# Patient Record
Sex: Female | Born: 1955 | Hispanic: Yes | Smoking: Never smoker
Health system: Southern US, Community
[De-identification: ages and names within clinical notes are randomized; demographics above are authoritative.]

## PROBLEM LIST (undated history)

## (undated) DIAGNOSIS — E119 Type 2 diabetes mellitus without complications: Secondary | ICD-10-CM

## (undated) DIAGNOSIS — N390 Urinary tract infection, site not specified: Secondary | ICD-10-CM

## (undated) DIAGNOSIS — F32A Depression, unspecified: Secondary | ICD-10-CM

## (undated) DIAGNOSIS — I10 Essential (primary) hypertension: Secondary | ICD-10-CM

## (undated) HISTORY — DX: Urinary tract infection, site not specified: N39.0

## (undated) HISTORY — PX: FOOT SURGERY: SHX648

---

## 2003-05-24 ENCOUNTER — Other Ambulatory Visit: Payer: Self-pay

## 2003-05-31 ENCOUNTER — Other Ambulatory Visit: Payer: Self-pay

## 2003-11-24 ENCOUNTER — Ambulatory Visit: Payer: Self-pay

## 2005-05-14 ENCOUNTER — Ambulatory Visit: Payer: Self-pay

## 2007-01-29 ENCOUNTER — Emergency Department: Payer: Self-pay | Admitting: Emergency Medicine

## 2007-05-11 ENCOUNTER — Ambulatory Visit: Payer: Self-pay

## 2008-07-26 ENCOUNTER — Ambulatory Visit: Payer: Self-pay

## 2010-12-17 ENCOUNTER — Ambulatory Visit: Payer: Self-pay

## 2012-12-13 ENCOUNTER — Ambulatory Visit: Payer: Self-pay | Admitting: Internal Medicine

## 2013-02-23 ENCOUNTER — Ambulatory Visit: Payer: Self-pay | Admitting: Urgent Care

## 2014-09-13 ENCOUNTER — Ambulatory Visit: Payer: Self-pay | Attending: Oncology

## 2015-07-23 ENCOUNTER — Ambulatory Visit: Payer: Self-pay

## 2015-08-22 ENCOUNTER — Ambulatory Visit: Payer: Self-pay | Attending: Internal Medicine

## 2017-04-22 ENCOUNTER — Ambulatory Visit: Payer: Self-pay | Attending: Oncology

## 2018-09-06 ENCOUNTER — Ambulatory Visit
Admission: RE | Admit: 2018-09-06 | Discharge: 2018-09-06 | Disposition: A | Discharge status: Home / Self Care | Payer: Self-pay | Source: Ambulatory Visit | Attending: General Practice | Admitting: General Practice

## 2018-09-06 ENCOUNTER — Other Ambulatory Visit: Payer: Self-pay | Admitting: General Practice

## 2018-09-06 DIAGNOSIS — M542 Cervicalgia: Secondary | ICD-10-CM | POA: Insufficient documentation

## 2021-03-17 ENCOUNTER — Emergency Department: Payer: Medicare Other

## 2021-03-17 ENCOUNTER — Other Ambulatory Visit: Payer: Self-pay

## 2021-03-17 ENCOUNTER — Emergency Department
Admission: EM | Admit: 2021-03-17 | Discharge: 2021-03-17 | Disposition: A | Discharge status: Home / Self Care | Payer: Medicare Other | Attending: Emergency Medicine | Admitting: Emergency Medicine

## 2021-03-17 DIAGNOSIS — S6991XA Unspecified injury of right wrist, hand and finger(s), initial encounter: Secondary | ICD-10-CM | POA: Diagnosis present

## 2021-03-17 DIAGNOSIS — S66110A Strain of flexor muscle, fascia and tendon of right index finger at wrist and hand level, initial encounter: Secondary | ICD-10-CM

## 2021-03-17 DIAGNOSIS — R002 Palpitations: Secondary | ICD-10-CM | POA: Diagnosis not present

## 2021-03-17 DIAGNOSIS — X58XXXA Exposure to other specified factors, initial encounter: Secondary | ICD-10-CM | POA: Insufficient documentation

## 2021-03-17 HISTORY — DX: Essential (primary) hypertension: I10

## 2021-03-17 HISTORY — DX: Depression, unspecified: F32.A

## 2021-03-17 HISTORY — DX: Type 2 diabetes mellitus without complications: E11.9

## 2021-03-17 LAB — COMPREHENSIVE METABOLIC PANEL
ALT: 20 U/L (ref 0–44)
AST: 24 U/L (ref 15–41)
Albumin: 3.8 g/dL (ref 3.5–5.0)
Alkaline Phosphatase: 57 U/L (ref 38–126)
Anion gap: 8 (ref 5–15)
BUN: 17 mg/dL (ref 8–23)
CO2: 29 mmol/L (ref 22–32)
Calcium: 9.4 mg/dL (ref 8.9–10.3)
Chloride: 100 mmol/L (ref 98–111)
Creatinine, Ser: 0.66 mg/dL (ref 0.44–1.00)
GFR, Estimated: >60 mL/min (ref >60)
Glucose, Bld: 161 mg/dL — ABNORMAL HIGH (ref 70–99)
Potassium: 3.9 mmol/L (ref 3.5–5.1)
Sodium: 137 mmol/L (ref 135–145)
Total Bilirubin: 0.5 mg/dL (ref 0.3–1.2)
Total Protein: 8.1 g/dL (ref 6.5–8.1)

## 2021-03-17 LAB — CBC WITH DIFFERENTIAL/PLATELET
Abs Immature Granulocytes: 0.03 10*3/uL (ref 0.00–0.07)
Basophils Absolute: 0 10*3/uL (ref 0.0–0.1)
Basophils Relative: 0 %
Eosinophils Absolute: 0 10*3/uL (ref 0.0–0.5)
Eosinophils Relative: 0 %
HCT: 41.4 % (ref 36.0–46.0)
Hemoglobin: 14.1 g/dL (ref 12.0–15.0)
Immature Granulocytes: 1 %
Lymphocytes Relative: 38 %
Lymphs Abs: 2 10*3/uL (ref 0.7–4.0)
MCH: 31.5 pg (ref 26.0–34.0)
MCHC: 34.1 g/dL (ref 30.0–36.0)
MCV: 92.6 fL (ref 80.0–100.0)
Monocytes Absolute: 0.4 10*3/uL (ref 0.1–1.0)
Monocytes Relative: 8 %
Neutro Abs: 2.9 10*3/uL (ref 1.7–7.7)
Neutrophils Relative %: 53 %
Platelets: 227 10*3/uL (ref 150–400)
RBC: 4.47 MIL/uL (ref 3.87–5.11)
RDW: 12.2 % (ref 11.5–15.5)
WBC: 5.4 10*3/uL (ref 4.0–10.5)
nRBC: 0 % (ref 0.0–0.2)

## 2021-03-17 LAB — TSH: TSH: 0.719 u[IU]/mL (ref 0.350–4.500)

## 2021-03-17 MED ORDER — ACETAMINOPHEN 500 MG PO TABS
1000.0000 mg | ORAL_TABLET | ORAL | Status: AC
  Administered 2021-03-17: 1000 mg via ORAL
  Filled 2021-03-17: qty 2

## 2021-03-17 NOTE — ED Provider Notes (Signed)
? ?[facility] ?Provider Note ? ? ? Event Date/Time  ? First MD Initiated Contact with Patient 03/17/21 1545   ?  (approximate) ? ? ?History  ? ?Arm Pain ? ? ?HPI ? ?Christina Hatfield is a 66 y.o. female here for evaluation for pain in her right index finger.  Started this morning.  She noticed it when she was brushing her hair, and since then her she has had quite a bit of pain in just the right index finger.  Of note she also reports a couple days ago while she was sitting she felt like her heart was skipping beats but that is since resolved.  No chest pain no shortness of breath ? ?No fevers or chills.  Denies any injury to the fingers of her right hand.   ?  ?No shortness of breath.  No chest pain. ? ?Physical Exam  ? ?Triage Vital Signs: ?ED Triage Vitals  ?Enc Vitals Group  ?   BP 03/17/21 1529 131/87  ?   Pulse Rate 03/17/21 1529 (!) 104  ?   Resp 03/17/21 1529 20  ?   Temp 03/17/21 1529 98 ?F (36.7 ?C)  ?   Temp Source 03/17/21 1529 Oral  ?   SpO2 03/17/21 1529 98 %  ?   Weight --   ?   Height --   ?   Head Circumference --   ?   Peak Flow --   ?   Pain Score 03/17/21 1530 10  ?   Pain Loc --   ?   Pain Edu? --   ?   Excl. in GC? --   ? ? ?Most recent vital signs: ?Vitals:  ? 03/17/21 1529  ?BP: 131/87  ?Pulse: (!) 104  ?Resp: 20  ?Temp: 98 ?F (36.7 ?C)  ?SpO2: 98%  ? ?Examined and conducted physical with and via interpreter [place] , Spanish ? ? ?General: Awake, no distress.  ?CV:  Good peripheral perfusion.  Normal heart tones regular rate and rhythm. ?Resp:  Normal effort.  Clear lung sounds bilaterally. ?Abd:  No distention.  ?Other:   ? ?RIGHT ?Right upper extremity demonstrates normal strength, good use of all muscles except patient reports pain with flexing the right index finger. No edema bruising or contusions of the right shoulder/upper arm, right elbow, right forearm / hand. Full range of motion of the right right upper extremity without pain sipped at the right index finger  with flexion. No evidence of trauma. Strong radial pulse. Intact median/ulnar/radial neuro-muscular exam.  Capillary refill normal in all digits. ? ?Patient reports pain in the right index finger with flexion but not particularly with extension.  She is able to flex to approximately 70 degrees when she reports notable pain.  She does report slight tenderness along the proximal section of the flexor tendon but no surrounding warmth erythema redness or edema.  The exam of the hand visually appears quite normal including the right index finger.  No swelling.  No pain across the wrist up in the forearm. ? ?Denies pain in the right arm, but instead reports of all pain that she is experiencing is in the index finger the right hand ? ? ? ?ED Results / Procedures / Treatments  ? ?Labs ?(all labs ordered are listed, but only abnormal results are displayed) ?Labs Reviewed  ?COMPREHENSIVE METABOLIC PANEL - Abnormal; Notable for the following components:  ?    Result Value  ? Glucose, Bld 161 (*)   ?  All other components within normal limits  ?CBC WITH DIFFERENTIAL/PLATELET  ?TSH  ? ? ? ?EKG ? ?Is reviewed inter by me at 1540 ?Heart rate 100 ?QRS 80 ?QTc 440 ?Sinus tachycardia.  Apparent U wave noted on precordial T waves.  No acute ischemic abnormalities ? ? ?RADIOLOGY ? ? ? ?Personally reviewed and interpreted the patient's right index finger x-ray, negative for acute finding or gross bony destruction ? ?PROCEDURES: ? ?Critical Care performed: No ? ?Procedures ? ? ?MEDICATIONS ORDERED IN ED: ?Medications  ?acetaminophen (TYLENOL) tablet 1,000 mg (1,000 mg Oral Given 03/17/21 1646)  ? ? ? ?IMPRESSION / MDM / ASSESSMENT AND PLAN / ED COURSE  ?I reviewed the triage vital signs and the nursing notes. ?             ?               ? ?Differential diagnosis includes, but is not limited to, possible strain of the flexor tendon of the right index finger.  Pain started somewhat abruptly while brushing hair.  Examination very reassuring  no evidence of infection or joint space infection.  There is certainly no evidence support acute infectious tenosynovitis.  Suspect likely musculoskeletal cause, utilize Tylenol which patient reports is her preference of medication.  Discussed careful return precautions around this though she is develop a fever swelling redness warmth concerns for infection weakness cold or blue finger etc. she should return the emergency room right away which she is agreeable with ? ?Also the patient reported that she had palpitations a few days ago, these have since resolved.  EKG reassuring.  Perhaps a very slight U wave, but electrolyte panel here is reviewed and normal.  No symptoms of syncope chest pain or trouble breathing accompanied or palpitation. ? ?TSH normal ? ?The patient is on the cardiac monitor to evaluate for evidence of arrhythmia and/or significant heart rate changes. ? ?  ?Reviewed old records included urgent care visit for closed head injury from June 29, 2018.   ? ?FINAL CLINICAL IMPRESSION(S) / ED DIAGNOSES  ? ?Final diagnoses:  ?Strain of flexor muscle, fascia and tendon of right index finger at wrist and hand level, initial encounter  ?Palpitation  ? ? ? ?Rx / DC Orders  ? ?ED Discharge Orders   ? ? None  ? ?  ? ? ? ?Note:  This document was prepared using Dragon voice recognition software and may include unintentional dictation errors. ?  Sharyn Creamer, MD ?03/17/21 2226 ? ?

## 2021-03-17 NOTE — ED Triage Notes (Signed)
Pt presents to ED by POV with c/o of R arm pain that started this morning. Pt states palpations this past Wednesday. Pt denies palpations at this time. Pt denies any weakness at this time. Pt reports R index finger and states this is what is bothering her the most. VSS.  ? ?In person interpreter in triage.  ?

## 2021-03-17 NOTE — ED Notes (Addendum)
Pt to ED for R index finger pain that started last night and worsened this morning, states is extremely painful (sharp, stabbing) to move finger. States R arm also hurts, from elbow down, but pain is light and aching, not sharp.  ?Has never had this happen before. All fingers appears slightly enlarged and crooked at joints. ? ?Pt also states that 4 days ago, she felt like her heart stopped. She was sitting up, eating and suddenly she felt dizzy and she felt like her heart stopped. In that moment, did not have N/V or diaphoresis. States that this resolved rapidly.  ? ? ?Denies other problems, denies CP, denies urinary symptoms. States has been feeling a little weak lately but does not feel dizzy or lightheaded when she walks. ?

## 2021-07-10 ENCOUNTER — Encounter: Payer: Self-pay | Admitting: Family Medicine

## 2021-07-11 ENCOUNTER — Telehealth: Payer: Self-pay

## 2021-07-11 NOTE — Telephone Encounter (Signed)
CALLED PATIENT NO ANSWER LEFT VOICEMAIL FOR A CALL BACK\ Interp # [phone]

## 2021-07-12 ENCOUNTER — Telehealth: Payer: Self-pay

## 2021-07-12 NOTE — Telephone Encounter (Signed)
CALLED PATIENT NO ANSWER LEFT VOICEMAIL FOR A CALL BACK °Letter sent °

## 2021-12-09 ENCOUNTER — Other Ambulatory Visit: Payer: Self-pay

## 2021-12-09 NOTE — Progress Notes (Unsigned)
Referring Physician:  Resa Miner, MD [address] [place],  [place] [address]  Primary Physician:  Creig Hines, NP  Formal interpreter used as patient speaks spanish.  History of Present Illness: 12/10/2021 Christina Hatfield has a history of DM, HTN. Last HgbA1c on 09/11/21 was 12.6.   Referred by PCP for 5 month history of neck pain with soft tissue mass around C5-C6 that patient feels is growing in size.   She has constant posterior neck pain that radiates into back of her head and also into left arm x many years. No pain past the shoulder. She notes persistent swelling in posterior neck that varies in size. Tends to be worse when she does increased activity. Pain is worse with activity such as cleaning and cooking. No numbness, tingling, or weakness in arms. No balance or dexterity issues.   She is being seen for left shoulder bursitis and she's had shoulder injections in the past (last was in January).   Conservative measures:  Physical therapy: maybe in last year or so? With no improvement.  Multimodal medical therapy including regular antiinflammatories: mobic  Injections: No epidural steroid injections  Past Surgery: no spinal surgery.   Christina Hatfield has no symptoms of cervical myelopathy.  The symptoms are causing a significant impact on the patient's life.   Review of Systems:  A 10 point review of systems is negative, except for the pertinent positives and negatives detailed in the HPI.  Past Medical History: Past Medical History:  Diagnosis Date   Depression    Diabetes mellitus without complication (HCC)    Hypertension    Recurrent UTI     Past Surgical History: Past Surgical History:  Procedure Laterality Date   FOOT SURGERY      Allergies: Allergies as of 12/10/2021 - Review Complete 03/17/2021  Allergen Reaction Noted   Ampicillin Hives, Itching, and Rash 02/25/2013    Medications: Outpatient Encounter Medications as of  12/10/2021  Medication Sig   aspirin EC 81 MG tablet Take 81 mg by mouth daily.   clotrimazole (LOTRIMIN) 1 % cream Apply topically.   D-5000 125 MCG (5000 UT) TABS Take 1 tablet by mouth daily.   escitalopram (LEXAPRO) 10 MG tablet Take 10 mg by mouth daily.   ezetimibe (ZETIA) 10 MG tablet Take 10 mg by mouth daily.   LANTUS SOLOSTAR 100 UNIT/ML Solostar Pen Inject 35 Units into the skin 2 (two) times daily.   losartan (COZAAR) 50 MG tablet Take 50 mg by mouth daily.   meloxicam (MOBIC) 7.5 MG tablet Take 7.5 mg by mouth daily.   rosuvastatin (CRESTOR) 20 MG tablet Take 20 mg by mouth at bedtime.   triamcinolone cream (KENALOG) 0.5 % Apply topically.   TRULICITY 0.75 MG/0.5ML SOPN Inject 1.5 mg into the skin once a week.   No facility-administered encounter medications on file as of 12/10/2021.    Social History: Social History   Tobacco Use   Smoking status: Never   Smokeless tobacco: Never    Family Medical History: No family history on file.  Physical Examination: Vitals:   12/10/21 1013  BP: (!) 148/88    General: Patient is well developed, well nourished, calm, collected, and in no apparent distress. Attention to examination is appropriate.  Respiratory: Patient is breathing without any difficulty.   NEUROLOGICAL:     Awake, alert, oriented to person, place, and time.  Speech is clear and fluent. Fund of knowledge is appropriate.   Cranial  Nerves: Pupils equal round and reactive to light.  Facial tone is symmetric.  Facial sensation is symmetric.  She has no posterior cervical tenderness. She has area of soft tissue swelling lower posterior cervical spine that is slightly to left. It is nontender and not fluctuant.   No abnormal lesions on exposed skin.   Strength: Side Biceps Triceps Deltoid Interossei Grip Wrist Ext. Wrist Flex.  R 5 5 5 5 5 5 5   L 5 5 5 5 5 5 5    Side Iliopsoas Quads Hamstring PF DF EHL  R 5 5 5 5 5 5   L 5 5 5 5 5 5    Reflexes are  2+ and symmetric at the biceps, triceps, brachioradialis, patella and achilles.   Hoffman's is absent.  Clonus is not present.   Bilateral upper and lower extremity sensation is intact to light touch.     Gait is normal.    Medical Decision Making  Imaging: Cervical xrays 09/06/2018:  FINDINGS: Progressive degenerative disc disease spanning C4-T1. These levels demonstrate disc space narrowing, sclerosis and endplate osteophytes. Very minimal anterolisthesis C4 upon C5 measuring 2 mm. Normal prevertebral soft tissues. No acute compression fracture, wedge-shaped deformity or focal kyphosis. Facets appear aligned and foramina patent. Intact odontoid. Trachea is midline. Lung apices are clear.   IMPRESSION: Similar straightened cervical spine alignment with loss of lordosis and progressive degenerative changes spanning C4-T1. Very minimal anterolisthesis of C4 upon C5 likely degenerative.   No other acute finding by plain radiography.     Electronically Signed   By: .  Shick M.D.   On: 09/06/2018 17:01   I have personally reviewed the images and agree with the above interpretation. May have slight slip at C5-C6 as well.   Assessment and Plan: Christina Hatfield is a pleasant 66 y.o. female with constant posterior neck pain that radiates into back of her head and also into left arm x many years. No pain past the shoulder. She notes persistent swelling in posterior neck that varies in size. Tends to be worse when she does increased activity.   Previous cervical xrays from 2020 showed diffuse cervical spondylosis and DDD with slip at C4-C5 and slight slip at C5-C6.   She has area of posterior cervical swelling that may be soft tissue. No tenderness or fluctuance.   Treatment options discussed with patient and following plan made:   - MRI of cervical spine to evaluate chronic neck pain and evaluation of mass posterior cervical spine (probable soft tissue). This needs to be done at [facility] MRI in [place] as she cannot tolerate closed MRI.  - Continue on current medications including mobic from other providers. Reviewed proper dosing along with risks and benefits. Take with food.  - Discussed revisiting PT. No relief with this in the past so will hold off.  - She will f/u in clinic to review MRI with me. Will need interpreter.   - BP was slightly elevated, it was 148/88 initially. Recheck was 142/82. No symptoms of chest pain, shortness of breath, blurry vision, or headaches. If she develops CP, SOB, blurry vision, or headaches, then she will go to ED.     I spent a total of 30 minutes in face-to-face and non-face-to-face activities related to this patient's care today including review of outside records, review of imaging, review of symptoms, physical exam, discussion of differential diagnosis, discussion of treatment options, and documentation.   Thank you for involving me in the care of this patient.  Geronimo Boot PA-C Dept. of Neurosurgery

## 2021-12-10 ENCOUNTER — Encounter: Payer: Self-pay | Admitting: Orthopedic Surgery

## 2021-12-10 ENCOUNTER — Ambulatory Visit (INDEPENDENT_AMBULATORY_CARE_PROVIDER_SITE_OTHER): Payer: Medicare Other | Admitting: Orthopedic Surgery

## 2021-12-10 VITALS — BP 142/82 | Wt 160.2 lb

## 2021-12-10 DIAGNOSIS — M542 Cervicalgia: Secondary | ICD-10-CM

## 2021-12-10 DIAGNOSIS — M5030 Other cervical disc degeneration, unspecified cervical region: Secondary | ICD-10-CM | POA: Diagnosis not present

## 2021-12-10 DIAGNOSIS — N888 Other specified noninflammatory disorders of cervix uteri: Secondary | ICD-10-CM

## 2021-12-10 DIAGNOSIS — M5412 Radiculopathy, cervical region: Secondary | ICD-10-CM

## 2021-12-10 DIAGNOSIS — M4312 Spondylolisthesis, cervical region: Secondary | ICD-10-CM | POA: Diagnosis not present

## 2021-12-10 DIAGNOSIS — M4722 Other spondylosis with radiculopathy, cervical region: Secondary | ICD-10-CM

## 2021-12-10 DIAGNOSIS — M47812 Spondylosis without myelopathy or radiculopathy, cervical region: Secondary | ICD-10-CM

## 2021-12-10 NOTE — Patient Instructions (Signed)
It was so nice to see you today, I am sorry that you are hurting so much.   You have some arthritis (wear and tear) in your neck from your previous xrays.   Continue on meloxicam to help with pain and inflammation. Take as directed with food.   I want to get an MRI of your neck to look into things further. We will call you to set this up. Will set up at OPEN MRI at [facility] in [place].   I will see you back to review your MRI results. Please do not hesitate to call if you have any questions or concerns. You can also message me in MyChart.   If you have not heard back about any of the tests/procedures in the next week, please call the office so we can help you get these things scheduled.   Drake Leach PA-C [phone]

## 2022-05-12 ENCOUNTER — Other Ambulatory Visit: Payer: Self-pay | Admitting: Family Medicine

## 2022-05-12 DIAGNOSIS — Z1231 Encounter for screening mammogram for malignant neoplasm of breast: Secondary | ICD-10-CM

## 2022-07-10 ENCOUNTER — Inpatient Hospital Stay
Admission: RE | Admit: 2022-07-10 | Discharge: 2022-07-10 | Disposition: A | Discharge status: Home / Self Care | Payer: Self-pay | Source: Ambulatory Visit | Attending: Family Medicine | Admitting: Family Medicine

## 2022-07-10 ENCOUNTER — Other Ambulatory Visit: Payer: Self-pay | Admitting: *Deleted

## 2022-07-10 DIAGNOSIS — Z1231 Encounter for screening mammogram for malignant neoplasm of breast: Secondary | ICD-10-CM

## 2022-07-16 ENCOUNTER — Ambulatory Visit
Admission: RE | Admit: 2022-07-16 | Discharge: 2022-07-16 | Disposition: A | Discharge status: Home / Self Care | Payer: Medicare Other | Source: Ambulatory Visit | Attending: Family Medicine | Admitting: Family Medicine

## 2022-07-16 ENCOUNTER — Encounter: Payer: Self-pay | Admitting: Radiology

## 2022-07-16 DIAGNOSIS — Z1231 Encounter for screening mammogram for malignant neoplasm of breast: Secondary | ICD-10-CM | POA: Insufficient documentation

## 2023-06-26 ENCOUNTER — Other Ambulatory Visit: Payer: Self-pay | Admitting: Family Medicine

## 2023-06-26 DIAGNOSIS — Z1231 Encounter for screening mammogram for malignant neoplasm of breast: Secondary | ICD-10-CM

## 2023-08-11 IMAGING — DX DG FINGER INDEX 2+V*R*
3 series · 3 of 3 positions shown · non-contrast
Comparison: None.

CLINICAL DATA: Pain, exam normal.  Pain of the index finger.

EXAM:
RIGHT INDEX FINGER 2+V

[finger ap]
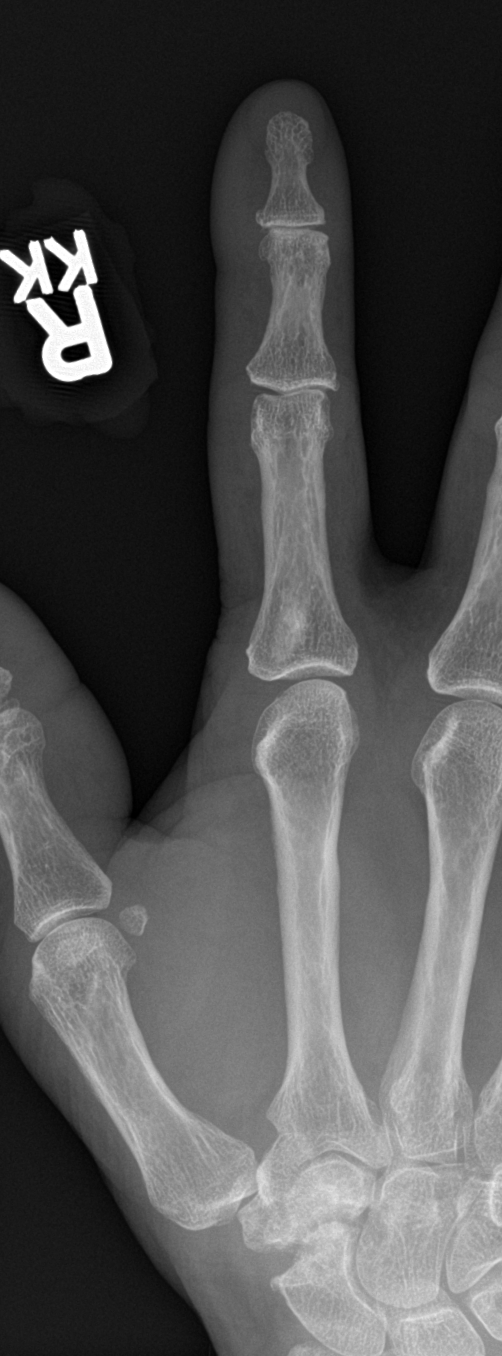

[finger obl]
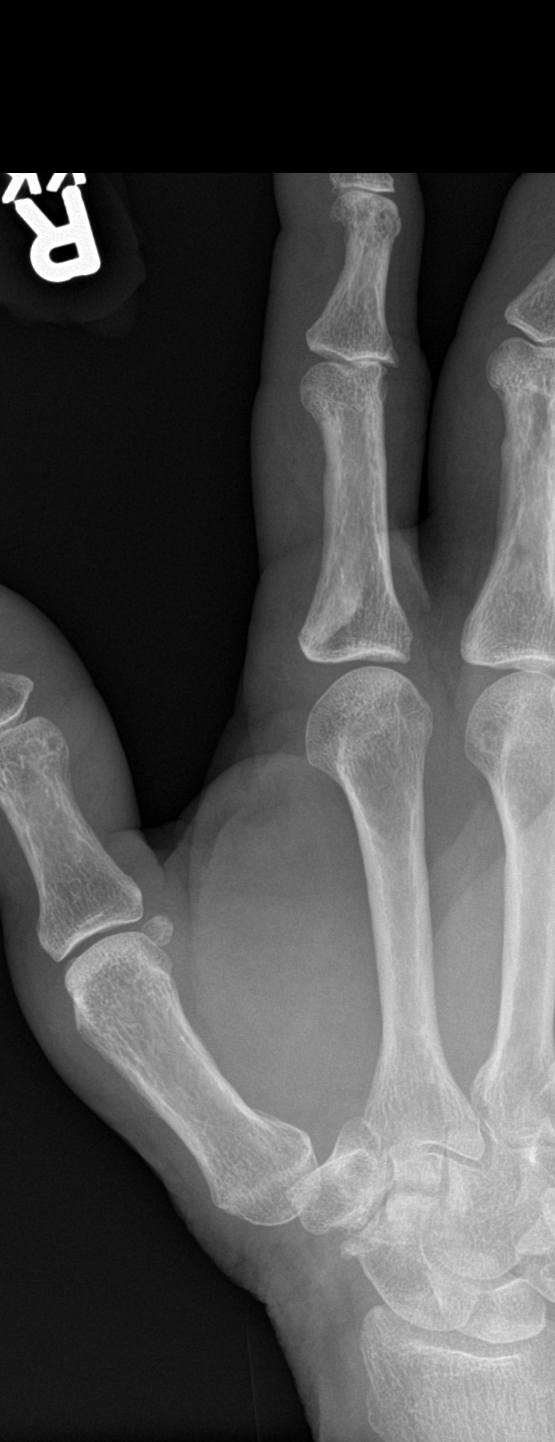

[finger lat]
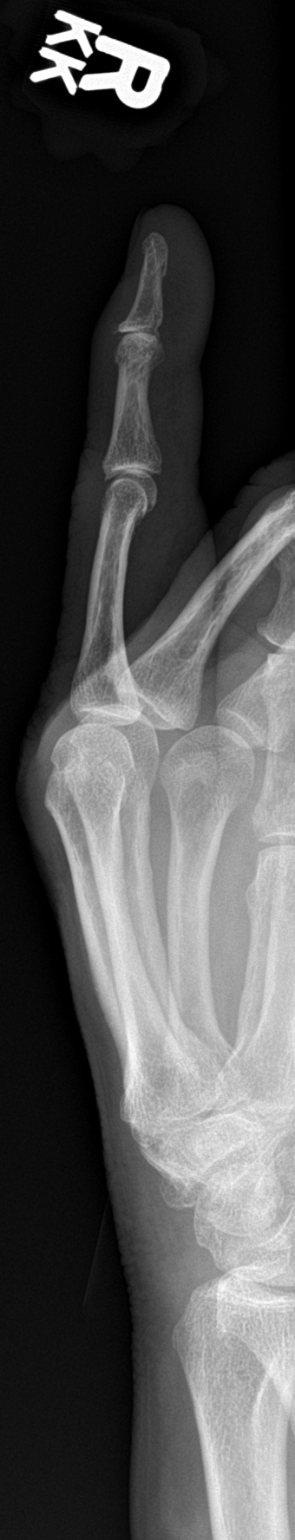

[3 of 3 positions shown; findings below may reference images not displayed]

FINDINGS: There is no evidence of fracture or dislocation. Mild degenerative
change of the distal and proximal interphalangeal joints with
spurring and slight joint space narrowing. No erosion or
periostitis. Sclerotic density in the proximal phalanx is typically
a bone island. Soft tissues are unremarkable.
IMPRESSION: Mild osteoarthritis of the distal and proximal interphalangeal
joints. No acute findings.

## 2023-09-01 ENCOUNTER — Ambulatory Visit
Admission: RE | Admit: 2023-09-01 | Discharge: 2023-09-01 | Disposition: A | Discharge status: Home / Self Care | Source: Ambulatory Visit | Attending: Family Medicine | Admitting: Family Medicine

## 2023-09-01 DIAGNOSIS — Z1231 Encounter for screening mammogram for malignant neoplasm of breast: Secondary | ICD-10-CM | POA: Insufficient documentation
